# Patient Record
Sex: Male | Born: 1995 | Race: Black or African American | Hispanic: No | Marital: Single | Smoking: Never smoker
Health system: Southern US, Community
[De-identification: ages and names within clinical notes are randomized; demographics above are authoritative.]

## PROBLEM LIST (undated history)

## (undated) DIAGNOSIS — D869 Sarcoidosis, unspecified: Secondary | ICD-10-CM

## (undated) DIAGNOSIS — D84821 Immunodeficiency due to drugs: Secondary | ICD-10-CM

## (undated) DIAGNOSIS — H269 Unspecified cataract: Secondary | ICD-10-CM

## (undated) DIAGNOSIS — Z79899 Other long term (current) drug therapy: Secondary | ICD-10-CM

## (undated) DIAGNOSIS — H409 Unspecified glaucoma: Secondary | ICD-10-CM

## (undated) DIAGNOSIS — H44113 Panuveitis, bilateral: Secondary | ICD-10-CM

## (undated) HISTORY — PX: EYE SURGERY: SHX253

---
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
## 2017-08-11 ENCOUNTER — Other Ambulatory Visit: Payer: Self-pay

## 2017-08-11 ENCOUNTER — Encounter (HOSPITAL_COMMUNITY): Payer: Self-pay

## 2017-08-11 ENCOUNTER — Emergency Department (HOSPITAL_COMMUNITY): Payer: 59

## 2017-08-11 DIAGNOSIS — R05 Cough: Secondary | ICD-10-CM | POA: Diagnosis present

## 2017-08-11 DIAGNOSIS — R0981 Nasal congestion: Secondary | ICD-10-CM | POA: Insufficient documentation

## 2017-08-11 DIAGNOSIS — Z5321 Procedure and treatment not carried out due to patient leaving prior to being seen by health care provider: Secondary | ICD-10-CM | POA: Insufficient documentation

## 2017-08-11 DIAGNOSIS — R1110 Vomiting, unspecified: Secondary | ICD-10-CM | POA: Insufficient documentation

## 2017-08-11 LAB — CBC WITH DIFFERENTIAL/PLATELET
BASOS ABS: 0 10*3/uL (ref 0.0–0.1)
Basophils Relative: 0 %
EOS ABS: 0 10*3/uL (ref 0.0–0.7)
EOS PCT: 0 %
HCT: 38 % — ABNORMAL LOW (ref 39.0–52.0)
Hemoglobin: 12.8 g/dL — ABNORMAL LOW (ref 13.0–17.0)
Lymphocytes Relative: 8 %
Lymphs Abs: 1.1 10*3/uL (ref 0.7–4.0)
MCH: 29.8 pg (ref 26.0–34.0)
MCHC: 33.7 g/dL (ref 30.0–36.0)
MCV: 88.4 fL (ref 78.0–100.0)
Monocytes Absolute: 1.7 10*3/uL — ABNORMAL HIGH (ref 0.1–1.0)
Monocytes Relative: 13 %
Neutro Abs: 10.6 10*3/uL — ABNORMAL HIGH (ref 1.7–7.7)
Neutrophils Relative %: 79 %
PLATELETS: 541 10*3/uL — AB (ref 150–400)
RBC: 4.3 MIL/uL (ref 4.22–5.81)
RDW: 11.7 % (ref 11.5–15.5)
WBC: 13.4 10*3/uL — ABNORMAL HIGH (ref 4.0–10.5)

## 2017-08-11 LAB — BASIC METABOLIC PANEL
ANION GAP: 11 (ref 5–15)
BUN: 11 mg/dL (ref 6–20)
CO2: 23 mmol/L (ref 22–32)
Calcium: 9.4 mg/dL (ref 8.9–10.3)
Chloride: 98 mmol/L — ABNORMAL LOW (ref 101–111)
Creatinine, Ser: 0.89 mg/dL (ref 0.61–1.24)
GFR calc Af Amer: >60 mL/min (ref >60)
Glucose, Bld: 114 mg/dL — ABNORMAL HIGH (ref 65–99)
Potassium: 3.6 mmol/L (ref 3.5–5.1)
SODIUM: 132 mmol/L — AB (ref 135–145)

## 2017-08-11 NOTE — ED Triage Notes (Signed)
Pt presents with vomiting, productive cough and nasal congestion.  Pt reports having cough before going overseas to [place] but symptoms have worsened.  Pt denies any fever, denies abdominal pain or diarrhea.  Pt only complains of L scapular pain.

## 2017-08-12 ENCOUNTER — Emergency Department (HOSPITAL_COMMUNITY)
Admission: EM | Admit: 2017-08-12 | Discharge: 2017-08-12 | Disposition: A | Discharge status: Home / Self Care | Payer: 59 | Attending: Emergency Medicine | Admitting: Emergency Medicine

## 2017-08-12 HISTORY — DX: Sarcoidosis, unspecified: D86.9

## 2017-08-12 NOTE — ED Notes (Signed)
Pt called twice for vitals no reply.

## 2017-08-12 NOTE — ED Notes (Signed)
Pt called x2 for vitals no reply.

## 2017-08-15 ENCOUNTER — Emergency Department (HOSPITAL_COMMUNITY): Admission: EM | Admit: 2017-08-15 | Discharge: 2017-08-15 | Discharge status: Absent without leave | Payer: Self-pay

## 2017-08-15 ENCOUNTER — Other Ambulatory Visit: Payer: Self-pay

## 2017-08-15 ENCOUNTER — Emergency Department (HOSPITAL_COMMUNITY)
Admission: EM | Admit: 2017-08-15 | Discharge: 2017-08-16 | Disposition: A | Discharge status: Home / Self Care | Payer: 59 | Attending: Emergency Medicine | Admitting: Emergency Medicine

## 2017-08-15 ENCOUNTER — Emergency Department (HOSPITAL_COMMUNITY): Payer: 59

## 2017-08-15 ENCOUNTER — Encounter (HOSPITAL_COMMUNITY): Payer: Self-pay

## 2017-08-15 DIAGNOSIS — J189 Pneumonia, unspecified organism: Secondary | ICD-10-CM | POA: Diagnosis not present

## 2017-08-15 DIAGNOSIS — R509 Fever, unspecified: Secondary | ICD-10-CM | POA: Diagnosis present

## 2017-08-15 DIAGNOSIS — J181 Lobar pneumonia, unspecified organism: Secondary | ICD-10-CM

## 2017-08-15 MED ORDER — ACETAMINOPHEN 325 MG PO TABS
650.0000 mg | ORAL_TABLET | Freq: Four times a day (QID) | ORAL | Status: DC | PRN
  Administered 2017-08-15: 650 mg via ORAL
  Filled 2017-08-15 (×2): qty 2

## 2017-08-15 NOTE — ED Triage Notes (Signed)
Pt complains of a cough and a fever, he just got back from overseas on last Tuesday

## 2017-08-15 NOTE — ED Provider Notes (Signed)
Patient placed in Quick Look pathway, seen and evaluated   Chief Complaint: cough and fever  HPI:   Pt complains of cough and fever.  Pt recentlyreturned from a study abroad program  ROS: back pain  Physical Exam:   Gen: No distress  Neuro: Awake and Alert  Skin: Warm    Focused Exam: Lungs harsh breath sounds   Initiation of care has begun. The patient has been counseled on the process, plan, and necessity for staying for the completion/evaluation, and the remainder of the medical screening examination   Kenneth CheeksSofia, Leslie K, PA-C 08/15/17 1951    Terrilee FilesButler, Michael C, MD 08/16/17 [phone]

## 2017-08-16 LAB — URINALYSIS, ROUTINE W REFLEX MICROSCOPIC
Bilirubin Urine: NEGATIVE
Glucose, UA: NEGATIVE mg/dL
Hgb urine dipstick: NEGATIVE
KETONES UR: NEGATIVE mg/dL
Leukocytes, UA: NEGATIVE
NITRITE: NEGATIVE
PROTEIN: NEGATIVE mg/dL
Specific Gravity, Urine: 1.019 (ref 1.005–1.030)
pH: 6 (ref 5.0–8.0)

## 2017-08-16 LAB — CBC WITH DIFFERENTIAL/PLATELET
BASOS ABS: 0 10*3/uL (ref 0.0–0.1)
BASOS PCT: 0 %
EOS ABS: 0 10*3/uL (ref 0.0–0.7)
EOS PCT: 0 %
HCT: 34.7 % — ABNORMAL LOW (ref 39.0–52.0)
Hemoglobin: 12 g/dL — ABNORMAL LOW (ref 13.0–17.0)
LYMPHS PCT: 8 %
Lymphs Abs: 0.7 10*3/uL (ref 0.7–4.0)
MCH: 30.2 pg (ref 26.0–34.0)
MCHC: 34.6 g/dL (ref 30.0–36.0)
MCV: 87.2 fL (ref 78.0–100.0)
MONO ABS: 1.2 10*3/uL — AB (ref 0.1–1.0)
Monocytes Relative: 13 %
Neutro Abs: 7.1 10*3/uL (ref 1.7–7.7)
Neutrophils Relative %: 79 %
Platelets: 443 10*3/uL — ABNORMAL HIGH (ref 150–400)
RBC: 3.98 MIL/uL — AB (ref 4.22–5.81)
RDW: 11.7 % (ref 11.5–15.5)
WBC: 9 10*3/uL (ref 4.0–10.5)

## 2017-08-16 LAB — BASIC METABOLIC PANEL
ANION GAP: 12 (ref 5–15)
BUN: 19 mg/dL (ref 6–20)
CALCIUM: 9.4 mg/dL (ref 8.9–10.3)
CO2: 25 mmol/L (ref 22–32)
Chloride: 97 mmol/L — ABNORMAL LOW (ref 101–111)
Creatinine, Ser: 1.08 mg/dL (ref 0.61–1.24)
Glucose, Bld: 106 mg/dL — ABNORMAL HIGH (ref 65–99)
Potassium: 4 mmol/L (ref 3.5–5.1)
SODIUM: 134 mmol/L — AB (ref 135–145)

## 2017-08-16 MED ORDER — SODIUM CHLORIDE 0.9 % IV BOLUS (SEPSIS)
1000.0000 mL | Freq: Once | INTRAVENOUS | Status: AC
  Administered 2017-08-16: 1000 mL via INTRAVENOUS

## 2017-08-16 MED ORDER — SODIUM CHLORIDE 0.9 % IV SOLN: INTRAVENOUS | Status: DC

## 2017-08-16 MED ORDER — LEVOFLOXACIN IN D5W 500 MG/100ML IV SOLN
500.0000 mg | Freq: Once | INTRAVENOUS | Status: AC
  Administered 2017-08-16: 500 mg via INTRAVENOUS
  Filled 2017-08-16: qty 100

## 2017-08-16 MED ORDER — HYDROCOD POLST-CPM POLST ER 10-8 MG/5ML PO SUER
5.0000 mL | Freq: Once | ORAL | Status: AC
  Administered 2017-08-16: 5 mL via ORAL
  Filled 2017-08-16: qty 5

## 2017-08-16 MED ORDER — DEXTROSE 5 % IV SOLN: 500.0000 mg | Freq: Once | INTRAVENOUS | Status: DC

## 2017-08-16 MED ORDER — ONDANSETRON HCL 4 MG/2ML IJ SOLN
4.0000 mg | Freq: Once | INTRAMUSCULAR | Status: AC
  Administered 2017-08-16: 4 mg via INTRAVENOUS
  Filled 2017-08-16: qty 2

## 2017-08-16 MED ORDER — LEVOFLOXACIN 500 MG PO TABS: 500.0000 mg | ORAL_TABLET | Freq: Every day | ORAL | 0 refills | Status: DC

## 2017-08-16 MED ORDER — HYDROCOD POLST-CPM POLST ER 10-8 MG/5ML PO SUER: 5.0000 mL | Freq: Two times a day (BID) | ORAL | 0 refills | Status: DC | PRN

## 2017-08-16 NOTE — ED Notes (Signed)
Pt aware we need a urine sample. Urinal at bedside. 

## 2017-08-16 NOTE — ED Provider Notes (Signed)
[facility]-EMERGENCY DEPT Provider Note   CSN: [id number] Arrival date & time: 08/15/17  1942     History   Chief Complaint Chief Complaint  Patient presents with  . Fever  . Cough    HPI Kenneth Kemp is a 22 y.o. male.  Patient presents with persistent cough for several days, with fever today. No nausea, however, he has had post-tussive vomiting only. He has pain in the right mid-back with cough. No pain with breathing or SOB. He has a history of sarcoid affecting primarily bowel, on Humira regularly. No diarrhea or abdominal pain. He reports recent travel to the [place] and [place] for a 2 week study abroad.    The history is provided by the patient. No language interpreter was used.    Past Medical History:  Diagnosis Date  . Sarcoidosis     There are no active problems to display for this patient.   Past Surgical History:  Procedure Laterality Date  . EYE SURGERY         Home Medications    Prior to Admission medications   Not on File    Family History History reviewed. No pertinent family history.  Social History Social History   Tobacco Use  . Smoking status: Never Smoker  . Smokeless tobacco: Never Used  Substance Use Topics  . Alcohol use: No    Frequency: Never  . Drug use: No     Allergies   Patient has no known allergies.   Review of Systems Review of Systems  Constitutional: Negative for chills and fever.  HENT: Negative.   Respiratory: Positive for cough. Negative for shortness of breath.   Cardiovascular: Negative.  Negative for chest pain.  Gastrointestinal: Positive for vomiting (post-tussive). Negative for abdominal pain, diarrhea and nausea.  Musculoskeletal: Positive for back pain.  Skin: Negative.   Neurological: Negative.      Physical Exam Updated Vital Signs BP 124/79   Pulse 88   Temp 100.1 F (37.8 C) (Oral)   Resp 18   SpO2 97%   Physical Exam  Constitutional: He is  oriented to person, place, and time. He appears well-developed and well-nourished.  HENT:  Head: Normocephalic.  Neck: Normal range of motion. Neck supple.  Cardiovascular: Normal rate and regular rhythm.  No murmur heard. Pulmonary/Chest: Effort normal and breath sounds normal. He has no wheezes. He has no rales. He exhibits no tenderness.  Poor respiratory effort.  Abdominal: Soft. Bowel sounds are normal. There is no tenderness. There is no rebound and no guarding.  Musculoskeletal: Normal range of motion.  Neurological: He is alert and oriented to person, place, and time.  Skin: Skin is warm and dry. No rash noted.  Psychiatric: He has a normal mood and affect.     ED Treatments / Results  Labs (all labs ordered are listed, but only abnormal results are displayed) Labs Reviewed  CBC WITH DIFFERENTIAL/PLATELET  BASIC METABOLIC PANEL  URINALYSIS, ROUTINE W REFLEX MICROSCOPIC    EKG  EKG Interpretation None       Radiology Dg Chest 2 View  Result Date: 08/15/2017 CLINICAL DATA:  Productive cough, history of sarcoid EXAM: CHEST  2 VIEW COMPARISON:  08/11/2017 FINDINGS: Small focus of opacity in the right lateral lung base. No pleural effusion. Normal heart size. No pneumothorax. IMPRESSION: Small focus of opacity in the right lateral lung base may reflect an infiltrate. Electronically Signed   By: Jasmine Pang M.D.   On: 08/15/2017 20:18  Procedures Procedures (including critical care time)  Medications Ordered in ED Medications  acetaminophen (TYLENOL) tablet 650 mg (650 mg Oral Given 08/15/17 1952)  ondansetron (ZOFRAN) injection 4 mg (not administered)  sodium chloride 0.9 % bolus 1,000 mL (not administered)  chlorpheniramine-HYDROcodone (TUSSIONEX) 10-8 MG/5ML suspension 5 mL (not administered)  azithromycin (ZITHROMAX) 500 mg in dextrose 5 % 250 mL IVPB (not administered)     Initial Impression / Assessment and Plan / ED Course  I have reviewed the triage  vital signs and the nursing notes.  Pertinent labs & imaging results that were available during my care of the patient were reviewed by me and considered in my medical decision making (see chart for details).  Clinical Course as of Aug 16 156  Tue Aug 16, 2017  0032 DG Chest 2 View [FM]    Clinical Course User Index [FM] Cherylann RatelMoche, Faithcaren W, Wisconsintudent-PA    Patient presents with cough and fever, feeling unwell. He reports pain in the right mid-back with coughing. No SOB or painful respirations.   Despite recent travel, feel symptoms present CAP. Tachycardia only with fever. No pleuritic pain, hemoptysis, hypoxia. He is on Humira for sarcoid so is considered immunocompromised. Will treat PNA (RLL infiltrate on x-ray) with Levaquin.  IVF's and antibiotics completed. He VS remain stable. He can be discharged home.  Final Clinical Impressions(s) / ED Diagnoses   Final diagnoses:  None   1. RLL pneumonia  ED Discharge Orders    None       Elpidio AnisUpstill, Elly Haffey, PA-C 08/16/17 [id number]    Shon BatonHorton, Courtney F, MD 08/16/17 [phone]

## 2018-09-21 ENCOUNTER — Emergency Department (HOSPITAL_COMMUNITY)
Admission: EM | Admit: 2018-09-21 | Discharge: 2018-09-21 | Disposition: A | Discharge status: Home / Self Care | Payer: Self-pay | Attending: Emergency Medicine | Admitting: Emergency Medicine

## 2018-09-21 ENCOUNTER — Emergency Department (HOSPITAL_COMMUNITY): Payer: Self-pay

## 2018-09-21 ENCOUNTER — Encounter (HOSPITAL_COMMUNITY): Payer: Self-pay

## 2018-09-21 ENCOUNTER — Other Ambulatory Visit: Payer: Self-pay

## 2018-09-21 DIAGNOSIS — S82892A Other fracture of left lower leg, initial encounter for closed fracture: Secondary | ICD-10-CM

## 2018-09-21 DIAGNOSIS — Y999 Unspecified external cause status: Secondary | ICD-10-CM | POA: Insufficient documentation

## 2018-09-21 DIAGNOSIS — Y929 Unspecified place or not applicable: Secondary | ICD-10-CM | POA: Insufficient documentation

## 2018-09-21 DIAGNOSIS — W1840XA Slipping, tripping and stumbling without falling, unspecified, initial encounter: Secondary | ICD-10-CM | POA: Insufficient documentation

## 2018-09-21 DIAGNOSIS — Y9344 Activity, trampolining: Secondary | ICD-10-CM | POA: Insufficient documentation

## 2018-09-21 DIAGNOSIS — S8254XA Nondisplaced fracture of medial malleolus of right tibia, initial encounter for closed fracture: Secondary | ICD-10-CM | POA: Insufficient documentation

## 2018-09-21 NOTE — ED Provider Notes (Signed)
[facility]-EMERGENCY DEPT Provider Note   CSN: [id number] Arrival date & time: 09/21/18  9242    History   Chief Complaint Chief Complaint  Patient presents with  . Ankle Injury    HPI Kenneth Kemp is a 23 y.o. male.     The history is provided by the patient.  Ankle Injury  This is a new problem. The current episode started 2 days ago. The problem occurs constantly. The problem has been gradually worsening. Associated symptoms comments: Pain, swelling of the right ankle.  No numbness in the toes or other injury.  He was at a trampoline park on Tuesday night and came down wrong and his ankle twisted underneath him.  Since that time he has not really been able to walk on the right foot and it is continuing to swell and hurt worse.. The symptoms are aggravated by walking, bending and twisting. Nothing relieves the symptoms. He has tried rest for the symptoms. The treatment provided no relief.    Past Medical History:  Diagnosis Date  . Sarcoidosis     There are no active problems to display for this patient.   Past Surgical History:  Procedure Laterality Date  . EYE SURGERY          Home Medications    Prior to Admission medications   Medication Sig Start Date End Date Taking? Authorizing Provider  PRESCRIPTION MEDICATION Inject 10 mg into the muscle once a week. Pt states he was doing Humira weekly. He is unsure of Mg. York Spaniel it was sent in the mail and he hasn't took it in a couple of weeks cause of no insurance.   Yes [provider]  chlorpheniramine-HYDROcodone (TUSSIONEX PENNKINETIC ER) 10-8 MG/5ML SUER Take 5 mLs by mouth every 12 (twelve) hours as needed for cough. Patient not taking: Reported on 09/21/2018 08/16/17   Elpidio Anis, PA-C  levofloxacin (LEVAQUIN) 500 MG tablet Take 1 tablet (500 mg total) by mouth daily. Patient not taking: Reported on 09/21/2018 08/16/17   Elpidio Anis, PA-C    Family History Family History    Family history unknown: Yes    Social History Social History   Tobacco Use  . Smoking status: Never Smoker  . Smokeless tobacco: Never Used  Substance Use Topics  . Alcohol use: No    Frequency: Never  . Drug use: No     Allergies   Patient has no known allergies.   Review of Systems Review of Systems  All other systems reviewed and are negative.    Physical Exam Updated Vital Signs BP (!) 150/85 (BP Location: Right Arm)   Pulse (!) 112   Temp 99.8 F (37.7 C) (Oral)   Resp 16   Ht 5\' 11"  (1.803 m)   Wt 88.5 kg   SpO2 98%   BMI 27.20 kg/m   Physical Exam Vitals signs and nursing note reviewed.  Constitutional:      Appearance: Normal appearance. He is normal weight.  HENT:     Head: Normocephalic and atraumatic.  Eyes:     Pupils: Pupils are equal, round, and reactive to light.  Cardiovascular:     Rate and Rhythm: Tachycardia present.     Pulses: Normal pulses.  Pulmonary:     Effort: Pulmonary effort is normal.  Musculoskeletal:     Right ankle: He exhibits decreased range of motion and swelling. He exhibits normal pulse. Tenderness. Lateral malleolus, medial malleolus and head of 5th metatarsal tenderness found. No proximal  fibula tenderness found.  Neurological:     General: No focal deficit present.     Mental Status: He is alert. Mental status is at baseline.  Psychiatric:        Mood and Affect: Mood normal.      ED Treatments / Results  Labs (all labs ordered are listed, but only abnormal results are displayed) Labs Reviewed - No data to display  EKG None  Radiology Dg Ankle Complete Right  Result Date: 09/21/2018 CLINICAL DATA:  Right ankle pain post trampoline injury. EXAM: RIGHT ANKLE - COMPLETE 3+ VIEW COMPARISON:  None. FINDINGS: There is a severely comminuted fracture of the medial malleolus with extension to the ankle mortise. There is a small joint effusion. Medial and lateral soft tissue swelling noted. IMPRESSION: Severely  comminuted fracture of the medial malleolus with extension to the ankle mortise. Electronically Signed   By: Ted Mcalpine M.D.   On: 09/21/2018 09:35   Dg Foot Complete Right  Result Date: 09/21/2018 CLINICAL DATA:  Pain post trampoline injury. EXAM: RIGHT FOOT COMPLETE - 3+ VIEW COMPARISON:  None. FINDINGS: There is no evidence of fracture or dislocation of the right foot. There is a severely comminuted mildly displaced fracture of the medial malleolus. IMPRESSION: Severely comminuted mildly displaced fracture of the medial malleolus. No evidence of right foot fractures. Electronically Signed   By: Ted Mcalpine M.D.   On: 09/21/2018 09:36    Procedures Procedures (including critical care time)  Medications Ordered in ED Medications - No data to display   Initial Impression / Assessment and Plan / ED Course  I have reviewed the triage vital signs and the nursing notes.  Pertinent labs & imaging results that were available during my care of the patient were reviewed by me and considered in my medical decision making (see chart for details).        Patient is a healthy 23 year old male with an injury to his right ankle at a trampoline park 2 days ago.  Ankle is swollen but neurovascularly intact with normal sensation and movement of the toes.  He has no fibular head tenderness.  Patient's x-ray show a severely comminuted fracture of the medial malleolus with extension to the ankle mortise.  Foot films are normal.  Will discuss with orthopedics.  10:47 AM Discussed patient with Dr. Victorino Dike who recommended Cadillac splint, elevation crutches and they will see him in the office tomorrow for suspected surgery next week.  Findings discussed with the patient.  He will call them when he gets home.  Patient was made nonweightbearing.  Final Clinical Impressions(s) / ED Diagnoses   Final diagnoses:  Closed left ankle fracture, initial encounter    ED Discharge Orders    None         Gwyneth Sprout, MD 09/21/18 1050

## 2018-09-21 NOTE — Discharge Instructions (Signed)
You need to elevate your toes above your nose to help with the swelling in your ankle.  You should not put any weight on the leg and use crutches when getting around.  No work until cleared by the orthopedist.  Bonita Quin can take Tylenol and ibuprofen as needed for pain.

## 2018-09-21 NOTE — ED Triage Notes (Signed)
Patient was at the trampoline park and came down wrong on his right ankle 2 days ago. paatient has swelling and pain.

## 2018-09-25 ENCOUNTER — Other Ambulatory Visit: Payer: Self-pay

## 2018-09-25 ENCOUNTER — Encounter (HOSPITAL_BASED_OUTPATIENT_CLINIC_OR_DEPARTMENT_OTHER): Payer: Self-pay | Admitting: *Deleted

## 2018-09-25 ENCOUNTER — Other Ambulatory Visit (HOSPITAL_COMMUNITY): Payer: Self-pay | Admitting: Orthopedic Surgery

## 2018-09-26 ENCOUNTER — Encounter (HOSPITAL_BASED_OUTPATIENT_CLINIC_OR_DEPARTMENT_OTHER): Payer: Self-pay | Admitting: *Deleted

## 2018-09-26 ENCOUNTER — Encounter (HOSPITAL_BASED_OUTPATIENT_CLINIC_OR_DEPARTMENT_OTHER)
Admission: RE | Disposition: A | Discharge status: Home / Self Care | Payer: Self-pay | Source: Home / Self Care | Attending: Orthopedic Surgery

## 2018-09-26 ENCOUNTER — Ambulatory Visit (HOSPITAL_BASED_OUTPATIENT_CLINIC_OR_DEPARTMENT_OTHER): Payer: Self-pay | Admitting: Anesthesiology

## 2018-09-26 ENCOUNTER — Ambulatory Visit (HOSPITAL_BASED_OUTPATIENT_CLINIC_OR_DEPARTMENT_OTHER)
Admission: RE | Admit: 2018-09-26 | Discharge: 2018-09-26 | Disposition: A | Discharge status: Home / Self Care | Payer: Self-pay | Attending: Orthopedic Surgery | Admitting: Orthopedic Surgery

## 2018-09-26 DIAGNOSIS — D869 Sarcoidosis, unspecified: Secondary | ICD-10-CM | POA: Insufficient documentation

## 2018-09-26 DIAGNOSIS — S8251XA Displaced fracture of medial malleolus of right tibia, initial encounter for closed fracture: Secondary | ICD-10-CM | POA: Insufficient documentation

## 2018-09-26 DIAGNOSIS — Z91120 Patient's intentional underdosing of medication regimen due to financial hardship: Secondary | ICD-10-CM | POA: Insufficient documentation

## 2018-09-26 DIAGNOSIS — T394X6A Underdosing of antirheumatics, not elsewhere classified, initial encounter: Secondary | ICD-10-CM | POA: Insufficient documentation

## 2018-09-26 DIAGNOSIS — Y9344 Activity, trampolining: Secondary | ICD-10-CM | POA: Insufficient documentation

## 2018-09-26 DIAGNOSIS — H44113 Panuveitis, bilateral: Secondary | ICD-10-CM | POA: Insufficient documentation

## 2018-09-26 DIAGNOSIS — H409 Unspecified glaucoma: Secondary | ICD-10-CM | POA: Insufficient documentation

## 2018-09-26 HISTORY — PX: ORIF ANKLE FRACTURE: SHX5408

## 2018-09-26 HISTORY — DX: Unspecified cataract: H26.9

## 2018-09-26 HISTORY — DX: Panuveitis, bilateral: H44.113

## 2018-09-26 HISTORY — DX: Other long term (current) drug therapy: Z79.899

## 2018-09-26 HISTORY — DX: Immunodeficiency due to drugs: D84.821

## 2018-09-26 HISTORY — DX: Unspecified glaucoma: H40.9

## 2018-09-26 LAB — COMPREHENSIVE METABOLIC PANEL
ALBUMIN: 3.8 g/dL (ref 3.5–5.0)
ALT: 48 U/L — ABNORMAL HIGH (ref 0–44)
AST: 38 U/L (ref 15–41)
Alkaline Phosphatase: 58 U/L (ref 38–126)
Anion gap: 10 (ref 5–15)
BILIRUBIN TOTAL: 1.7 mg/dL — AB (ref 0.3–1.2)
BUN: 18 mg/dL (ref 6–20)
CO2: 23 mmol/L (ref 22–32)
Calcium: 9.4 mg/dL (ref 8.9–10.3)
Chloride: 104 mmol/L (ref 98–111)
Creatinine, Ser: 1.02 mg/dL (ref 0.61–1.24)
GFR calc Af Amer: >60 mL/min (ref >60)
GFR calc non Af Amer: >60 mL/min (ref >60)
GLUCOSE: 95 mg/dL (ref 70–99)
Potassium: 4.3 mmol/L (ref 3.5–5.1)
Sodium: 137 mmol/L (ref 135–145)
Total Protein: 7.1 g/dL (ref 6.5–8.1)

## 2018-09-26 SURGERY — OPEN REDUCTION INTERNAL FIXATION (ORIF) ANKLE FRACTURE
Anesthesia: General | Site: Ankle | Laterality: Right

## 2018-09-26 MED ORDER — DEXAMETHASONE SODIUM PHOSPHATE 10 MG/ML IJ SOLN
INTRAMUSCULAR | Status: AC
  Filled 2018-09-26: qty 1

## 2018-09-26 MED ORDER — SENNA 8.6 MG PO TABS: 2.0000 | ORAL_TABLET | Freq: Two times a day (BID) | ORAL | 0 refills | Status: AC

## 2018-09-26 MED ORDER — FENTANYL CITRATE (PF) 100 MCG/2ML IJ SOLN
50.0000 ug | INTRAMUSCULAR | Status: AC | PRN
  Administered 2018-09-26: 50 ug via INTRAVENOUS
  Administered 2018-09-26 (×2): 25 ug via INTRAVENOUS

## 2018-09-26 MED ORDER — ONDANSETRON HCL 4 MG/2ML IJ SOLN
INTRAMUSCULAR | Status: DC | PRN
  Administered 2018-09-26: 4 mg via INTRAVENOUS

## 2018-09-26 MED ORDER — ASPIRIN EC 81 MG PO TBEC: 81.0000 mg | DELAYED_RELEASE_TABLET | Freq: Two times a day (BID) | ORAL | 0 refills | Status: AC

## 2018-09-26 MED ORDER — ONDANSETRON HCL 4 MG/2ML IJ SOLN
INTRAMUSCULAR | Status: AC
  Filled 2018-09-26: qty 2

## 2018-09-26 MED ORDER — ACETAMINOPHEN 500 MG PO TABS
1000.0000 mg | ORAL_TABLET | Freq: Once | ORAL | Status: AC
  Administered 2018-09-26: 1000 mg via ORAL

## 2018-09-26 MED ORDER — CHLORHEXIDINE GLUCONATE 4 % EX LIQD: 60.0000 mL | Freq: Once | CUTANEOUS | Status: DC

## 2018-09-26 MED ORDER — ACETAMINOPHEN 500 MG PO TABS
ORAL_TABLET | ORAL | Status: AC
  Filled 2018-09-26: qty 2

## 2018-09-26 MED ORDER — SODIUM CHLORIDE 0.9 % IV SOLN: INTRAVENOUS | Status: DC

## 2018-09-26 MED ORDER — DEXAMETHASONE SODIUM PHOSPHATE 4 MG/ML IJ SOLN
INTRAMUSCULAR | Status: DC | PRN
  Administered 2018-09-26: 10 mg via INTRAVENOUS

## 2018-09-26 MED ORDER — MIDAZOLAM HCL 2 MG/2ML IJ SOLN
1.0000 mg | INTRAMUSCULAR | Status: DC | PRN
  Administered 2018-09-26: 2 mg via INTRAVENOUS

## 2018-09-26 MED ORDER — LACTATED RINGERS IV SOLN
INTRAVENOUS | Status: DC
  Administered 2018-09-26 (×2): via INTRAVENOUS

## 2018-09-26 MED ORDER — PHENYLEPHRINE 40 MCG/ML (10ML) SYRINGE FOR IV PUSH (FOR BLOOD PRESSURE SUPPORT)
PREFILLED_SYRINGE | INTRAVENOUS | Status: DC | PRN
  Administered 2018-09-26: 80 ug via INTRAVENOUS

## 2018-09-26 MED ORDER — LIDOCAINE HCL (CARDIAC) PF 100 MG/5ML IV SOSY
PREFILLED_SYRINGE | INTRAVENOUS | Status: DC | PRN
  Administered 2018-09-26: 80 mg via INTRAVENOUS

## 2018-09-26 MED ORDER — CEFAZOLIN SODIUM-DEXTROSE 2-4 GM/100ML-% IV SOLN
INTRAVENOUS | Status: AC
  Filled 2018-09-26: qty 100

## 2018-09-26 MED ORDER — OXYCODONE HCL 5 MG PO TABS: 5.0000 mg | ORAL_TABLET | ORAL | 0 refills | Status: AC | PRN

## 2018-09-26 MED ORDER — DOCUSATE SODIUM 100 MG PO CAPS: 100.0000 mg | ORAL_CAPSULE | Freq: Two times a day (BID) | ORAL | 0 refills | Status: AC

## 2018-09-26 MED ORDER — FENTANYL CITRATE (PF) 100 MCG/2ML IJ SOLN
INTRAMUSCULAR | Status: AC
  Filled 2018-09-26: qty 2

## 2018-09-26 MED ORDER — MIDAZOLAM HCL 2 MG/2ML IJ SOLN
INTRAMUSCULAR | Status: AC
  Filled 2018-09-26: qty 2

## 2018-09-26 MED ORDER — BUPIVACAINE-EPINEPHRINE (PF) 0.5% -1:200000 IJ SOLN
INTRAMUSCULAR | Status: DC | PRN
  Administered 2018-09-26: 30 mL via PERINEURAL
  Administered 2018-09-26: 10 mL via PERINEURAL

## 2018-09-26 MED ORDER — CEFAZOLIN SODIUM-DEXTROSE 2-4 GM/100ML-% IV SOLN
2.0000 g | INTRAVENOUS | Status: AC
  Administered 2018-09-26: 2 g via INTRAVENOUS

## 2018-09-26 MED ORDER — CLONIDINE HCL (ANALGESIA) 100 MCG/ML EP SOLN
EPIDURAL | Status: DC | PRN
  Administered 2018-09-26: 100 ug

## 2018-09-26 MED ORDER — SCOPOLAMINE 1 MG/3DAYS TD PT72: 1.0000 | MEDICATED_PATCH | Freq: Once | TRANSDERMAL | Status: DC | PRN

## 2018-09-26 MED ORDER — PROPOFOL 10 MG/ML IV BOLUS
INTRAVENOUS | Status: DC | PRN
  Administered 2018-09-26: 200 mg via INTRAVENOUS

## 2018-09-26 MED ORDER — 0.9 % SODIUM CHLORIDE (POUR BTL) OPTIME
TOPICAL | Status: DC | PRN
  Administered 2018-09-26: 250 mL

## 2018-09-26 MED ORDER — PROMETHAZINE HCL 25 MG/ML IJ SOLN: 6.2500 mg | INTRAMUSCULAR | Status: DC | PRN

## 2018-09-26 MED ORDER — LIDOCAINE 2% (20 MG/ML) 5 ML SYRINGE
INTRAMUSCULAR | Status: AC
  Filled 2018-09-26: qty 5

## 2018-09-26 MED ORDER — PHENYLEPHRINE 40 MCG/ML (10ML) SYRINGE FOR IV PUSH (FOR BLOOD PRESSURE SUPPORT)
PREFILLED_SYRINGE | INTRAVENOUS | Status: AC
  Filled 2018-09-26: qty 10

## 2018-09-26 MED ORDER — FENTANYL CITRATE (PF) 100 MCG/2ML IJ SOLN: 25.0000 ug | INTRAMUSCULAR | Status: DC | PRN

## 2018-09-26 SURGICAL SUPPLY — 78 items
BANDAGE ESMARK 6X9 LF (GAUZE/BANDAGES/DRESSINGS) ×1 IMPLANT
BIT DRILL 2.4X140 LONG SOLID (BIT) ×2 IMPLANT
BIT DRILL CANNULTD 2.6 X 130MM (DRILL) IMPLANT
BLADE SURG 15 STRL LF DISP TIS (BLADE) ×2 IMPLANT
BLADE SURG 15 STRL SS (BLADE) ×4
BNDG COHESIVE 4X5 TAN STRL (GAUZE/BANDAGES/DRESSINGS) ×3 IMPLANT
BNDG COHESIVE 6X5 TAN STRL LF (GAUZE/BANDAGES/DRESSINGS) ×3 IMPLANT
BNDG ESMARK 4X9 LF (GAUZE/BANDAGES/DRESSINGS) IMPLANT
BNDG ESMARK 6X9 LF (GAUZE/BANDAGES/DRESSINGS) ×3
CANISTER SUCT 1200ML W/VALVE (MISCELLANEOUS) ×3 IMPLANT
CHLORAPREP W/TINT 26ML (MISCELLANEOUS) ×3 IMPLANT
COVER BACK TABLE 60X90IN (DRAPES) ×3 IMPLANT
COVER WAND RF STERILE (DRAPES) IMPLANT
CUFF TOURNIQUET SINGLE 34IN LL (TOURNIQUET CUFF) ×2 IMPLANT
DECANTER SPIKE VIAL GLASS SM (MISCELLANEOUS) IMPLANT
DRAPE EXTREMITY T 121X128X90 (DISPOSABLE) ×3 IMPLANT
DRAPE OEC MINIVIEW 54X84 (DRAPES) ×3 IMPLANT
DRAPE U-SHAPE 47X51 STRL (DRAPES) ×3 IMPLANT
DRILL CANNULATED 2.6 X 130MM (DRILL) ×3
DRSG MEPITEL 4X7.2 (GAUZE/BANDAGES/DRESSINGS) ×3 IMPLANT
DRSG PAD ABDOMINAL 8X10 ST (GAUZE/BANDAGES/DRESSINGS) ×6 IMPLANT
ELECT REM PT RETURN 9FT ADLT (ELECTROSURGICAL) ×3
ELECTRODE REM PT RTRN 9FT ADLT (ELECTROSURGICAL) ×1 IMPLANT
GAUZE SPONGE 4X4 12PLY STRL (GAUZE/BANDAGES/DRESSINGS) ×3 IMPLANT
GLOVE BIO SURGEON STRL SZ 6.5 (GLOVE) ×1 IMPLANT
GLOVE BIO SURGEON STRL SZ8 (GLOVE) ×3 IMPLANT
GLOVE BIO SURGEONS STRL SZ 6.5 (GLOVE) ×1
GLOVE BIOGEL PI IND STRL 7.0 (GLOVE) IMPLANT
GLOVE BIOGEL PI IND STRL 8 (GLOVE) ×2 IMPLANT
GLOVE BIOGEL PI INDICATOR 7.0 (GLOVE) ×4
GLOVE BIOGEL PI INDICATOR 8 (GLOVE) ×4
GLOVE ECLIPSE 6.5 STRL STRAW (GLOVE) ×2 IMPLANT
GLOVE ECLIPSE 8.0 STRL XLNG CF (GLOVE) ×3 IMPLANT
GLOVE EXAM NITRILE MD LF STRL (GLOVE) ×2 IMPLANT
GOWN STRL REUS W/ TWL LRG LVL3 (GOWN DISPOSABLE) ×1 IMPLANT
GOWN STRL REUS W/ TWL XL LVL3 (GOWN DISPOSABLE) ×2 IMPLANT
GOWN STRL REUS W/TWL LRG LVL3 (GOWN DISPOSABLE) ×4
GOWN STRL REUS W/TWL XL LVL3 (GOWN DISPOSABLE) ×4
K-WIRE SMOOTH TROCAR 2.0X150 (WIRE) ×3
K-WIRE SNGL END 1.2X150 (MISCELLANEOUS) ×3
KWIRE SMOOTH TROCAR 2.0X150 (WIRE) IMPLANT
KWIRE SNGL END 1.2X150 (MISCELLANEOUS) IMPLANT
NEEDLE HYPO 22GX1.5 SAFETY (NEEDLE) IMPLANT
NS IRRIG 1000ML POUR BTL (IV SOLUTION) ×3 IMPLANT
PACK BASIN DAY SURGERY FS (CUSTOM PROCEDURE TRAY) ×3 IMPLANT
PAD CAST 4YDX4 CTTN HI CHSV (CAST SUPPLIES) ×1 IMPLANT
PADDING CAST ABS 4INX4YD NS (CAST SUPPLIES)
PADDING CAST ABS COTTON 4X4 ST (CAST SUPPLIES) IMPLANT
PADDING CAST COTTON 4X4 STRL (CAST SUPPLIES) ×2
PADDING CAST COTTON 6X4 STRL (CAST SUPPLIES) ×3 IMPLANT
PENCIL BUTTON HOLSTER BLD 10FT (ELECTRODE) ×3 IMPLANT
PLATE MEDIAL MALLEOLUS 4H HOOK (Plate) ×2 IMPLANT
SANITIZER HAND PURELL 535ML FO (MISCELLANEOUS) ×3 IMPLANT
SCREW 4.0 X 40 LT (Screw) ×2 IMPLANT
SCREW LOCK PLATE R3 3.5X44 (Screw) ×2 IMPLANT
SCREW R3CON N/L PLATE 3.5X44 (Screw) ×2 IMPLANT
SHEET MEDIUM DRAPE 40X70 STRL (DRAPES) ×3 IMPLANT
SLEEVE SCD COMPRESS KNEE MED (MISCELLANEOUS) ×3 IMPLANT
SPLINT FAST PLASTER 5X30 (CAST SUPPLIES) ×40
SPLINT PLASTER CAST FAST 5X30 (CAST SUPPLIES) ×20 IMPLANT
SPONGE LAP 18X18 RF (DISPOSABLE) ×3 IMPLANT
STOCKINETTE 6  STRL (DRAPES) ×2
STOCKINETTE 6 STRL (DRAPES) ×1 IMPLANT
SUCTION FRAZIER HANDLE 10FR (MISCELLANEOUS) ×2
SUCTION TUBE FRAZIER 10FR DISP (MISCELLANEOUS) ×1 IMPLANT
SUT ETHILON 3 0 PS 1 (SUTURE) ×3 IMPLANT
SUT FIBERWIRE #2 38 T-5 BLUE (SUTURE)
SUT MNCRL AB 3-0 PS2 18 (SUTURE) ×2 IMPLANT
SUT VIC AB 0 SH 27 (SUTURE) IMPLANT
SUT VIC AB 2-0 SH 27 (SUTURE) ×2
SUT VIC AB 2-0 SH 27XBRD (SUTURE) ×1 IMPLANT
SUTURE FIBERWR #2 38 T-5 BLUE (SUTURE) IMPLANT
SYR BULB 3OZ (MISCELLANEOUS) ×3 IMPLANT
SYR CONTROL 10ML LL (SYRINGE) IMPLANT
TOWEL GREEN STERILE FF (TOWEL DISPOSABLE) ×6 IMPLANT
TUBE CONNECTING 20'X1/4 (TUBING) ×1
TUBE CONNECTING 20X1/4 (TUBING) ×2 IMPLANT
UNDERPAD 30X30 (UNDERPADS AND DIAPERS) ×3 IMPLANT

## 2018-09-26 NOTE — Op Note (Signed)
09/26/2018  2:03 PM  PATIENT:  Kenneth Kemp  23 y.o. male  PRE-OPERATIVE DIAGNOSIS:  right ankle medial malleolus fracture  POST-OPERATIVE DIAGNOSIS:  right ankle medial malleolus fracture  Procedure(s): 1.  Open treatment of right ankle medial malleolus fracture with internal fixation   2.  Stress exam of right ankle under fluoroscopy   3.  AP, mortise and lateral xrays of the right ankle  SURGEON:  Toni Arthurs, MD  ASSISTANT: Alfredo Martinez, PA-C  ANESTHESIA:   General, regional  EBL:  minimal   TOURNIQUET:   Total Tourniquet Time Documented: Thigh (Right) - 51 minutes Total: Thigh (Right) - 51 minutes  COMPLICATIONS:  None apparent  DISPOSITION:  Extubated, awake and stable to recovery.  INDICATION FOR PROCEDURE: The patient is a 23 year old male with a past medical history significant for sarcoidosis.  He injured his ankle just over a week ago jumping on a trampoline.  He has a comminuted and displaced medial malleolus fracture and presents now for operative treatment.  The risks and benefits of the alternative treatment options have been discussed in detail.  The patient wishes to proceed with surgery and specifically understands risks of bleeding, infection, nerve damage, blood clots, need for additional surgery, amputation and death.  PROCEDURE IN DETAIL:  After pre operative consent was obtained, and the correct operative site was identified, the patient was brought to the operating room and placed supine on the OR table.  Anesthesia was administered.  Pre-operative antibiotics were administered.  A surgical timeout was taken.  The right lower extremity was prepped and draped in standard sterile fashion with a tourniquet around the thigh.  The extremity was elevated and the tourniquet was inflated to 250 mmHg.  A longitudinal incision was then made over the medial malleolus.  Dissection was carried down through the subcutaneous tissues.  Periosteum was incised over the medial  malleolus.  The fracture site was identified.  It was irrigated copiously and cleaned of all hematoma.  The fracture was reduced and and was provisionally pinned through the medial malleolus across the fracture site.  A medial malleolus hook plate from [employer] was selected.  It was placed over the guidepin and impacted in place with a bone tamp.  The K wire was then overdrilled and a 4 mm cannulated screw was inserted.  The screw was tightened securing the plate to the bone at the medial malleolus and compressing the fracture site appropriately.  The plate was then secured to the metaphyseal bone of the distal tibia with locking and nonlocking 3.5 millimeter screws.  This served to buttress the oblique portion of the fracture proximally.  AP, lateral and mortise radiographs confirmed appropriate reduction of the fracture in appropriate position and length of all hardware.  Stress examination was then performed.  Dorsiflexion and external rotation stress was applied to the supinated forefoot.  There was no widening of the ankle mortise or medial clear space.  The wound was then irrigated copiously.  The deltoid ligament and periosteum were repaired over the fracture site with figure-of-eight 2-0 Vicryl sutures.  Subcutaneous tissues were then approximated with inverted simple sutures of 3-0 Monocryl.  The skin incision was closed with 3-0 nylon.  Sterile dressings were applied followed by a well-padded short leg splint.  The tourniquet was released after application of the dressings.  The patient was awakened from anesthesia and transported to the recovery room in stable condition.   FOLLOW UP PLAN: Nonweightbearing on the right lower extremity for 6  weeks postop.  Follow-up in 2 weeks for suture removal and conversion to a short leg cast.  Aspirin for DVT prophylaxis.   RADIOGRAPHS: AP, mortise and lateral radiographs of the right ankle are obtained intraoperatively.  These show interval reduction and  fixation of the medial malleolus fracture.  Hardware is appropriately positioned and of the appropriate lengths.    Alfredo Martinez PA-C was present and scrubbed for the duration of the operative case. His assistance was essential in positioning the patient, prepping and draping, gaining and maintaining exposure, performing the operation, closing and dressing the wounds and applying the splint.

## 2018-09-26 NOTE — Anesthesia Postprocedure Evaluation (Signed)
Anesthesia Post Note  Patient: Kenneth Kemp  Procedure(s) Performed: Open treatment of right ankle fracture with internal fixation (Right Ankle)     Patient location during evaluation: PACU Anesthesia Type: General Level of consciousness: awake and alert, awake and oriented Pain management: pain level controlled Vital Signs Assessment: post-procedure vital signs reviewed and stable Respiratory status: spontaneous breathing, nonlabored ventilation and respiratory function stable Cardiovascular status: blood pressure returned to baseline and stable Postop Assessment: no apparent nausea or vomiting Anesthetic complications: no    Last Vitals:  Vitals:   09/26/18 1404 09/26/18 1415  BP: (!) 96/42 (!) 108/58  Pulse: 77 68  Resp: 19 14  SpO2: 100% 100%    Last Pain:  Vitals:   09/26/18 1404  PainSc: Asleep                 Cecile Hearing

## 2018-09-26 NOTE — Anesthesia Procedure Notes (Signed)
Anesthesia Regional Block: Popliteal block   Pre-Anesthetic Checklist: ,, timeout performed, Correct Patient, Correct Site, Correct Laterality, Correct Procedure, Correct Position, site marked, Risks and benefits discussed,  Surgical consent,  Pre-op evaluation,  At surgeon's request and post-op pain management  Laterality: Right  Prep: chloraprep       Needles:  Injection technique: Single-shot  Needle Type: Echogenic Needle     Needle Length: 9cm  Needle Gauge: 21     Additional Needles:   Procedures:,,,, ultrasound used (permanent image in chart),,,,  Narrative:  Start time: 09/26/2018 12:01 PM End time: 09/26/2018 12:06 PM Injection made incrementally with aspirations every 5 mL.  Performed by: Personally  Anesthesiologist: Cecile Hearing, MD  Additional Notes: No pain on injection. No increased resistance to injection. Injection made in 5cc increments.  Good needle visualization.  Patient tolerated procedure well.

## 2018-09-26 NOTE — Discharge Instructions (Addendum)
Kenneth Hewitt, MD °[facility] ° °Please read the following information regarding your care after surgery. ° °Medications  °You only need a prescription for the narcotic pain medicine (ex. oxycodone, Percocet, Norco).  All of the other medicines listed below are available over the counter. °X Aleve 2 pills twice a day for the first 3 days after surgery. °X acetominophen (Tylenol) 650 mg every 4-6 hours as you need for minor to moderate pain °X oxycodone as prescribed for severe pain ° °Narcotic pain medicine (ex. oxycodone, Percocet, Vicodin) will cause constipation.  To prevent this problem, take the following medicines while you are taking any pain medicine. °X docusate sodium (Colace) 100 mg twice a day X senna (Senokot) 2 tablets twice a day ° °X To help prevent blood clots, take a baby aspirin (81 mg) twice a day after surgery.  You should also get up every hour while you are awake to move around.   ° °Weight Bearing °X Do not bear any weight on the operated leg or foot. ° °Cast / Splint / Dressing °X Keep your splint, cast or dressing clean and dry.  Don’t put anything (coat hanger, pencil, etc) down inside of it.  If it gets damp, use a hair dryer on the cool setting to dry it.  If it gets soaked, call the office to schedule an appointment for a cast change. ° °After your dressing, cast or splint is removed; you may shower, but do not soak or scrub the wound.  Allow the water to run over it, and then gently pat it dry. ° °Swelling °It is normal for you to have swelling where you had surgery.  To reduce swelling and pain, keep your toes above your nose for at least 3 days after surgery.  It may be necessary to keep your foot or leg elevated for several weeks.  If it hurts, it should be elevated. ° °Follow Up °Call my office at [phone] when you are discharged from the hospital or surgery center to schedule an appointment to be seen two weeks after surgery. ° °Call my office at [phone] if you  develop a fever >101.5° F, nausea, vomiting, bleeding from the surgical site or severe pain.   ° ° ° °Post Anesthesia Home Care Instructions ° °Activity: °Get plenty of rest for the remainder of the day. A responsible individual must stay with you for 24 hours following the procedure.  °For the next 24 hours, DO NOT: °-Drive a car °-Operate machinery °-Drink alcoholic beverages °-Take any medication unless instructed by your physician °-Make any legal decisions or sign important papers. ° °Meals: °Start with liquid foods such as gelatin or soup. Progress to regular foods as tolerated. Avoid greasy, spicy, heavy foods. If nausea and/or vomiting occur, drink only clear liquids until the nausea and/or vomiting subsides. Call your physician if vomiting continues. ° °Special Instructions/Symptoms: °Your throat may feel dry or sore from the anesthesia or the breathing tube placed in your throat during surgery. If this causes discomfort, gargle with warm salt water. The discomfort should disappear within 24 hours. ° °If you had a scopolamine patch placed behind your ear for the management of post- operative nausea and/or vomiting: ° °1. The medication in the patch is effective for 72 hours, after which it should be removed.  Wrap patch in a tissue and discard in the trash. Wash hands thoroughly with soap and water. °2. You may remove the patch earlier than 72 hours if you experience unpleasant side   effects which may include dry mouth, dizziness or visual disturbances. °3. Avoid touching the patch. Wash your hands with soap and water after contact with the patch. °  ° ° °Regional Anesthesia Blocks ° °1. Numbness or the inability to move the "blocked" extremity may last from 3-48 hours after placement. The length of time depends on the medication injected and your individual response to the medication. If the numbness is not going away after 48 hours, call your surgeon. ° °2. The extremity that is blocked will need to be  protected until the numbness is gone and the  Strength has returned. Because you cannot feel it, you will need to take extra care to avoid injury. Because it may be weak, you may have difficulty moving it or using it. You may not know what position it is in without looking at it while the block is in effect. ° °3. For blocks in the legs and feet, returning to weight bearing and walking needs to be done carefully. You will need to wait until the numbness is entirely gone and the strength has returned. You should be able to move your leg and foot normally before you try and bear weight or walk. You will need someone to be with you when you first try to ensure you do not fall and possibly risk injury. ° °4. Bruising and tenderness at the needle site are common side effects and will resolve in a few days. ° °5. Persistent numbness or new problems with movement should be communicated to the surgeon or the [facility] ([phone])/ [facility] ([phone]). °

## 2018-09-26 NOTE — H&P (Signed)
Kenneth Kemp is an 23 y.o. male.   Chief Complaint: Right ankle pain HPI: The patient is a 23 year old male with a past medical history significant for sarcoidosis.  He presents today for operative treatment of his displaced and unstable right ankle medial malleolus fracture.  He injured his ankle just over a week ago when he fell [place] trampoline park.  He has been prescribed Humira but does not take it due to the cost.  He is not a smoker.  No history of diabetes.  Past Medical History:  Diagnosis Date  . Cataract   . Glaucoma   . 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 Panuveitis, bilateral   . Sarcoidosis     Past Surgical History:  Procedure Laterality Date  . EYE SURGERY     multiple procedures see [facility] notes in Care Everywhere    Family History  Family history unknown: Yes   Social History:  reports that he has never smoked. He has never used smokeless tobacco. He reports that he does not drink alcohol or use drugs.  Allergies: No Known Allergies  Medications: None  No results found for this or any previous visit (from the past 48 hour(s)). No results found.  ROS no recent fever, chills, nausea, vomiting or changes in his appetite  Height 5\' 11"  (1.803 m), weight 88.5 kg. Physical Exam  Well-nourished well-developed young man in no apparent distress.  Alert and oriented x4.  Mood and affect are normal.  Extraocular motions are intact.  Respirations are unlabored.  Gait is nonweightbearing on the right.  Right lower extremity is splinted.  The toes have brisk capillary refill.  Intact sensibility to light touch dorsally and plantarly at the forefoot.  Active plantar flexion and dorsiflexion strength at the toes.  Moderate swelling about the ankle.  Skin is otherwise healthy and intact.  No lymphadenopathy.  Assessment/Plan Right ankle displaced and unstable medial malleolus fracture -to the operating room today for operative treatment of this  injury.  After pre operative consent was obtained, and the correct operative site was identified, the patient was brought to the operating room and placed supine on the OR table.  Anesthesia was administered.  Pre-operative antibiotics were administered.  A surgical timeout was taken.  Toni Arthurs, MD 09/26/2018, 12:05 PM

## 2018-09-26 NOTE — Anesthesia Preprocedure Evaluation (Signed)
Anesthesia Evaluation  Patient identified by MRN, date of birth, ID band Patient awake    Reviewed: Allergy & Precautions, NPO status , Patient's Chart, lab work & pertinent test results  Airway Mallampati: II  TM Distance: >3 FB Neck ROM: Full    Dental  (+) Teeth Intact, Dental Advisory Given Upper braces:   Pulmonary neg pulmonary ROS,    Pulmonary exam normal breath sounds clear to auscultation       Cardiovascular Exercise Tolerance: Good negative cardio ROS Normal cardiovascular exam Rhythm:Regular Rate:Normal     Neuro/Psych Panuveitis, bilateral    GI/Hepatic negative GI ROS, Sarcoidosis    Endo/Other  negative endocrine ROS  Renal/GU negative Renal ROS     Musculoskeletal right ankle medial malleolus fracture   Abdominal   Peds  Hematology negative hematology ROS (+)   Anesthesia Other Findings Day of surgery medications reviewed with the patient.  Reproductive/Obstetrics                             Anesthesia Physical Anesthesia Plan  ASA: II  Anesthesia Plan: General   Post-op Pain Management:    Induction: Intravenous  PONV Risk Score and Plan: 2 and Ondansetron, Midazolam and Dexamethasone  Airway Management Planned: LMA  Additional Equipment:   Intra-op Plan:   Post-operative Plan: Extubation in OR  Informed Consent: I have reviewed the patients History and Physical, chart, labs and discussed the procedure including the risks, benefits and alternatives for the proposed anesthesia with the patient or authorized representative who has indicated his/her understanding and acceptance.     Dental advisory given  Plan Discussed with: CRNA  Anesthesia Plan Comments:         Anesthesia Quick Evaluation

## 2018-09-26 NOTE — Progress Notes (Signed)
Assisted Dr. Turk with right, ultrasound guided, popliteal, adductor canal block. Side rails up, monitors on throughout procedure. See vital signs in flow sheet. Tolerated Procedure well. 

## 2018-09-26 NOTE — Anesthesia Procedure Notes (Signed)
Procedure Name: LMA Insertion Date/Time: 09/26/2018 12:48 PM Performed by: Gar Gibbon, CRNA Pre-anesthesia Checklist: Patient identified, Emergency Drugs available, Suction available and Patient being monitored Patient Re-evaluated:Patient Re-evaluated prior to induction Oxygen Delivery Method: Circle system utilized Preoxygenation: Pre-oxygenation with 100% oxygen Induction Type: IV induction Ventilation: Mask ventilation without difficulty LMA: LMA inserted LMA Size: 4.0 Number of attempts: 1 Airway Equipment and Method: Bite block Placement Confirmation: positive ETCO2 Tube secured with: Tape Dental Injury: Teeth and Oropharynx as per pre-operative assessment

## 2018-09-26 NOTE — Anesthesia Procedure Notes (Signed)
Anesthesia Regional Block: Adductor canal block   Pre-Anesthetic Checklist: ,, timeout performed, Correct Patient, Correct Site, Correct Laterality, Correct Procedure, Correct Position, site marked, Risks and benefits discussed,  Surgical consent,  Pre-op evaluation,  At surgeon's request and post-op pain management  Laterality: Right  Prep: chloraprep       Needles:  Injection technique: Single-shot  Needle Type: Echogenic Needle     Needle Length: 9cm  Needle Gauge: 21     Additional Needles:   Procedures:,,,, ultrasound used (permanent image in chart),,,,  Narrative:  Start time: 09/26/2018 12:07 PM End time: 09/26/2018 12:10 PM Injection made incrementally with aspirations every 5 mL.  Performed by: Personally  Anesthesiologist: Cecile Hearing, MD  Additional Notes: No pain on injection. No increased resistance to injection. Injection made in 5cc increments.  Good needle visualization.  Patient tolerated procedure well.

## 2018-09-26 NOTE — Transfer of Care (Signed)
Immediate Anesthesia Transfer of Care Note  Patient: Kenneth Kemp  Procedure(s) Performed: Open treatment of right ankle fracture with internal fixation (Right Ankle)  Patient Location: PACU  Anesthesia Type:GA combined with regional for post-op pain  Level of Consciousness: awake, sedated and patient cooperative  Airway & Oxygen Therapy: Patient Spontanous Breathing and Patient connected to face mask oxygen  Post-op Assessment: Report given to RN and Post -op Vital signs reviewed and stable  Post vital signs: Reviewed and stable  Last Vitals:  Vitals Value Taken Time  BP 96/42 09/26/2018  2:04 PM  Temp    Pulse 63 09/26/2018  2:05 PM  Resp 7 09/26/2018  2:05 PM  SpO2 100 % 09/26/2018  2:05 PM  Vitals shown include unvalidated device data.  Last Pain: There were no vitals filed for this visit.       Complications: No apparent anesthesia complications

## 2018-09-27 ENCOUNTER — Encounter (HOSPITAL_BASED_OUTPATIENT_CLINIC_OR_DEPARTMENT_OTHER): Payer: Self-pay | Admitting: Orthopedic Surgery

## 2018-12-15 IMAGING — CR DG CHEST 2V
2 series · 2 of 2 positions shown · non-contrast
Comparison: 08/11/2017

CLINICAL DATA: Productive cough, history of sarcoid

EXAM:
CHEST  2 VIEW

[w chest pa]
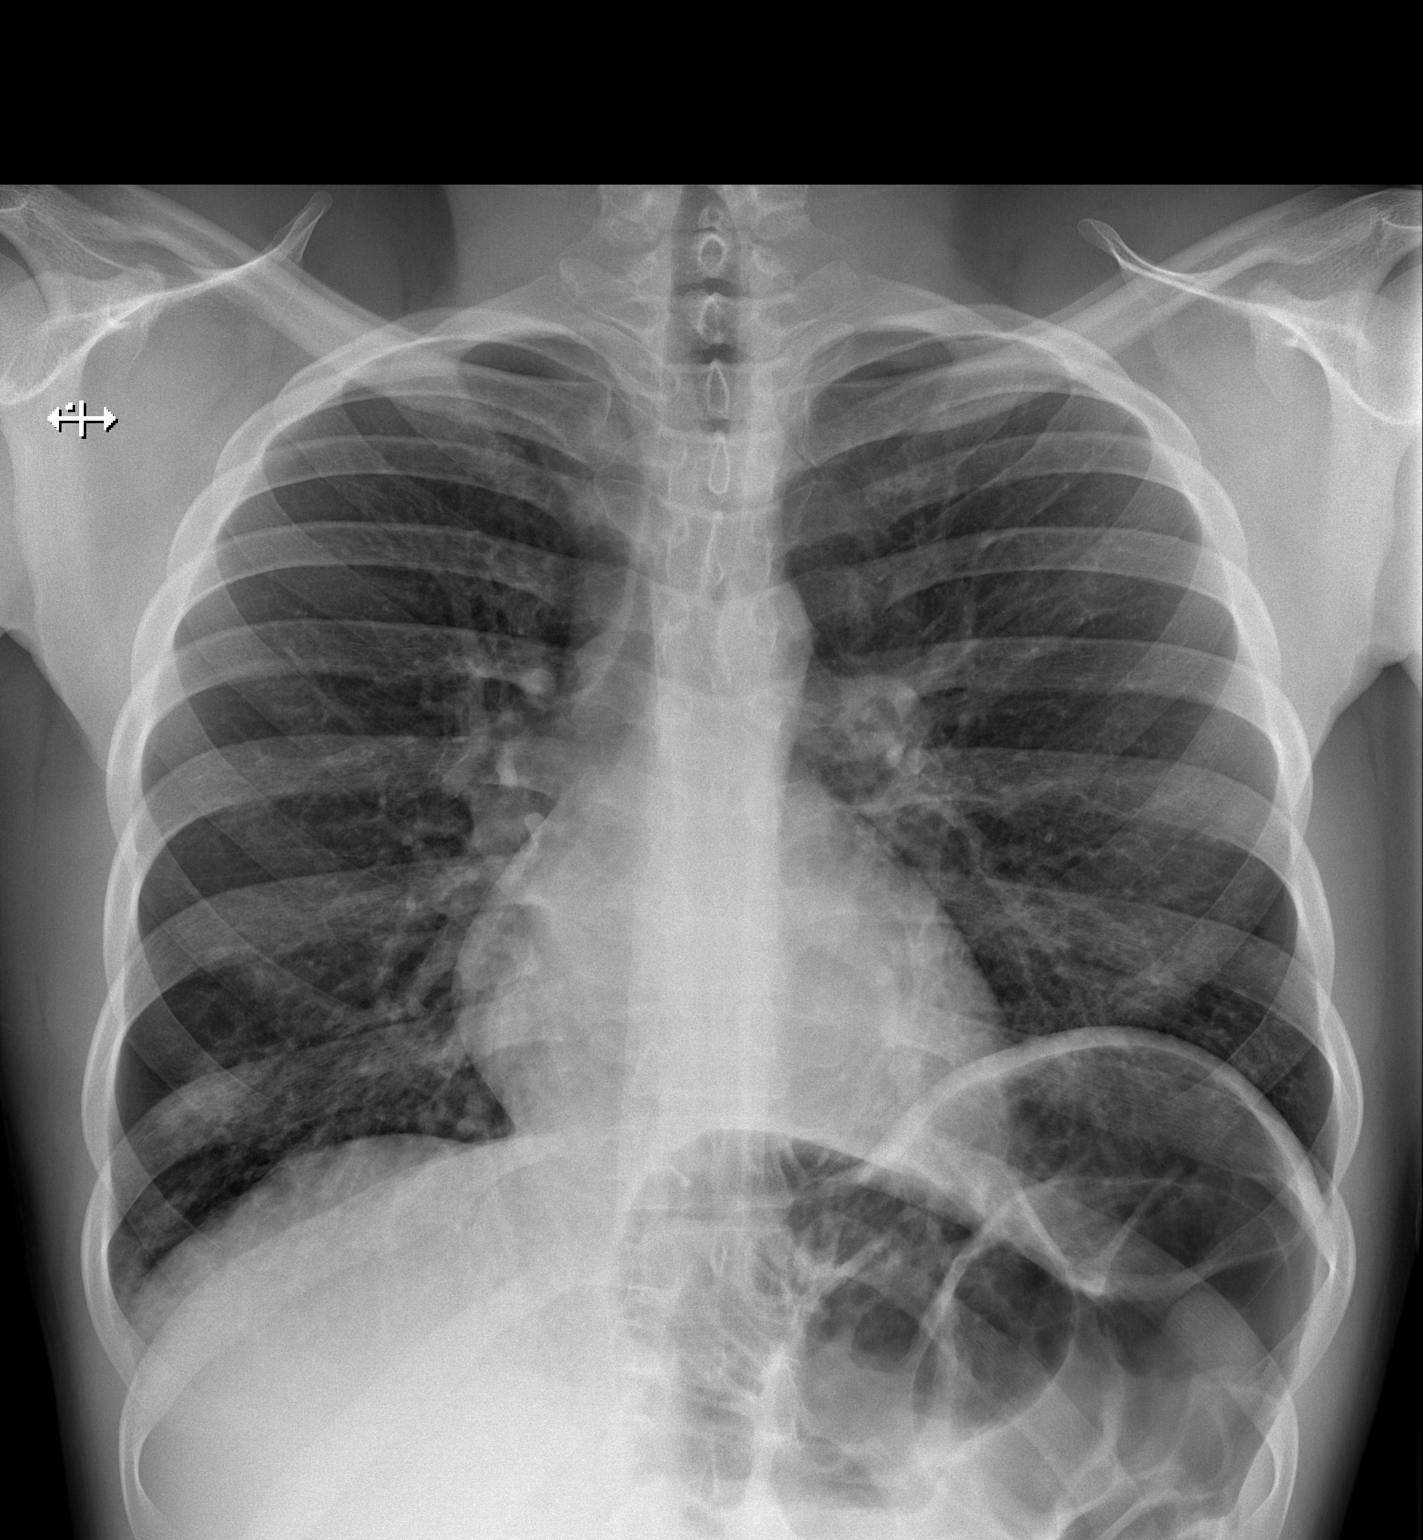

[w chest lat]
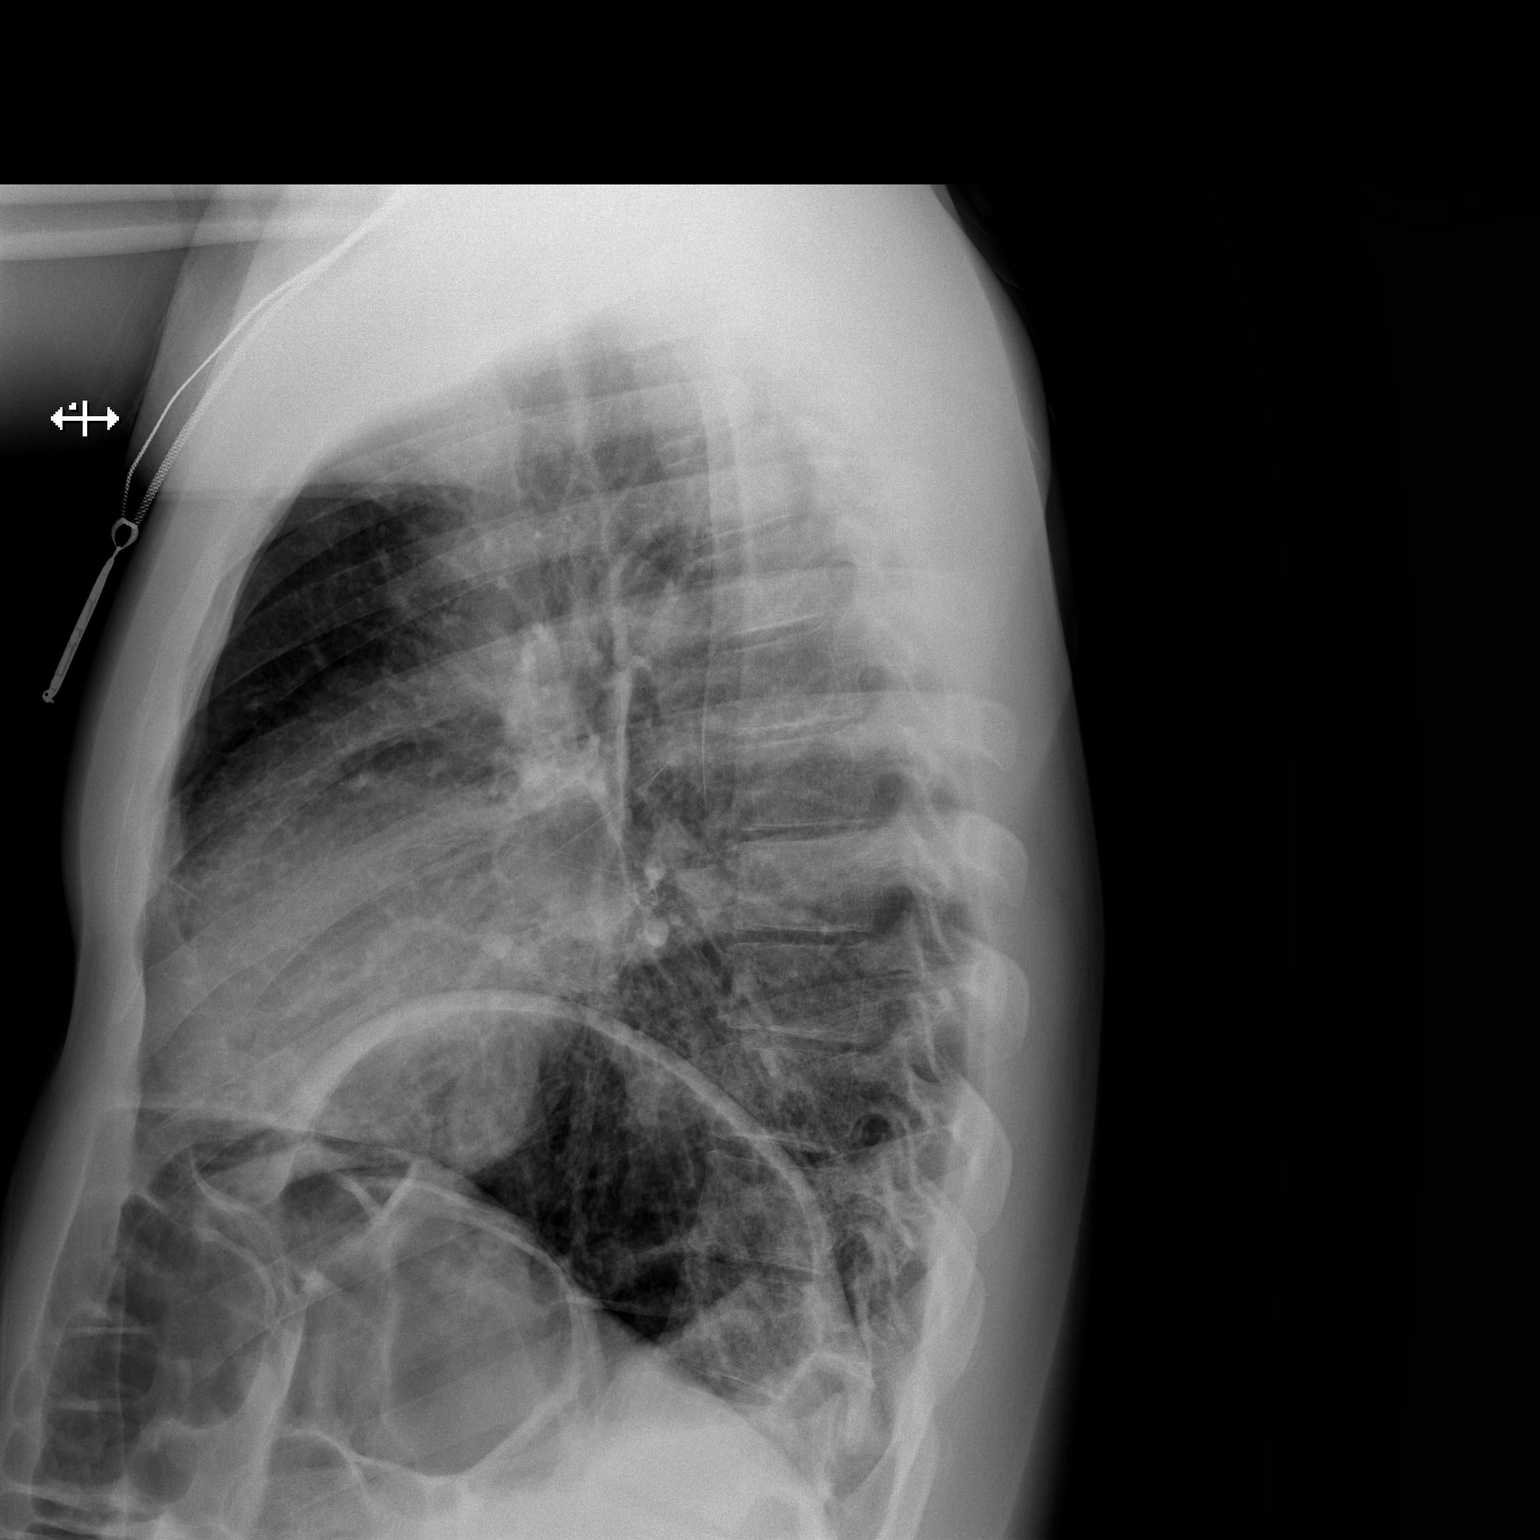

[2 of 2 positions shown; findings below may reference images not displayed]

FINDINGS: Small focus of opacity in the right lateral lung base. No pleural
effusion. Normal heart size. No pneumothorax.
IMPRESSION: Small focus of opacity in the right lateral lung base may reflect an
infiltrate.

## 2020-01-21 IMAGING — CR DG ANKLE COMPLETE 3+V*R*
3 series · 3 of 3 positions shown · non-contrast
Comparison: None.

CLINICAL DATA: Right ankle pain post trampoline injury.

EXAM:
RIGHT ANKLE - COMPLETE 3+ VIEW

[x ankle ap right]
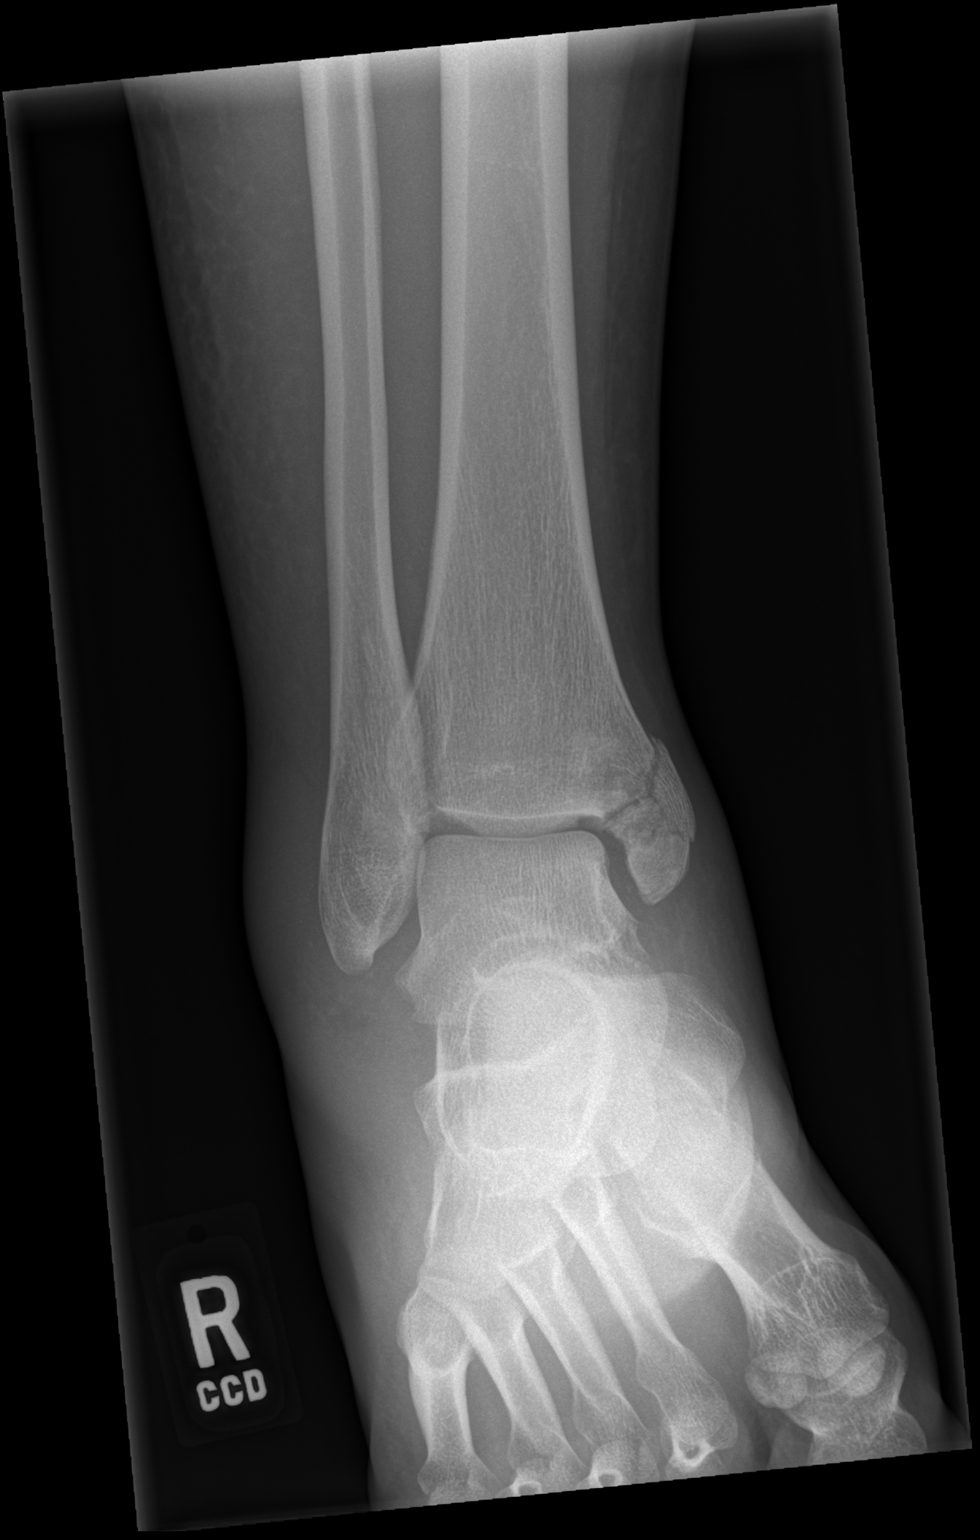

[x ankle obl right]
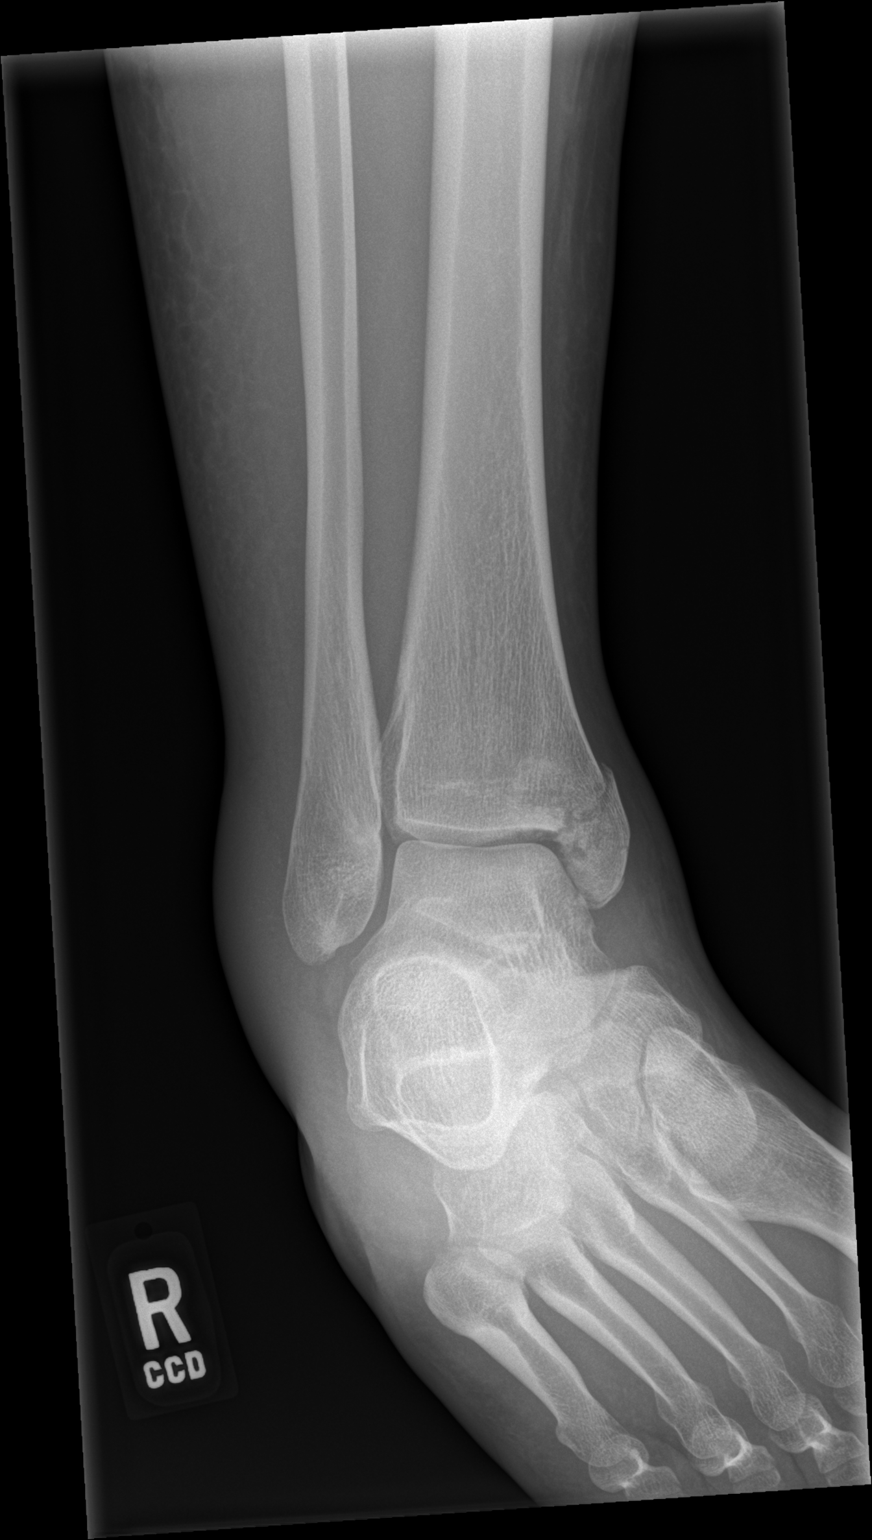

[x ankle lat right]
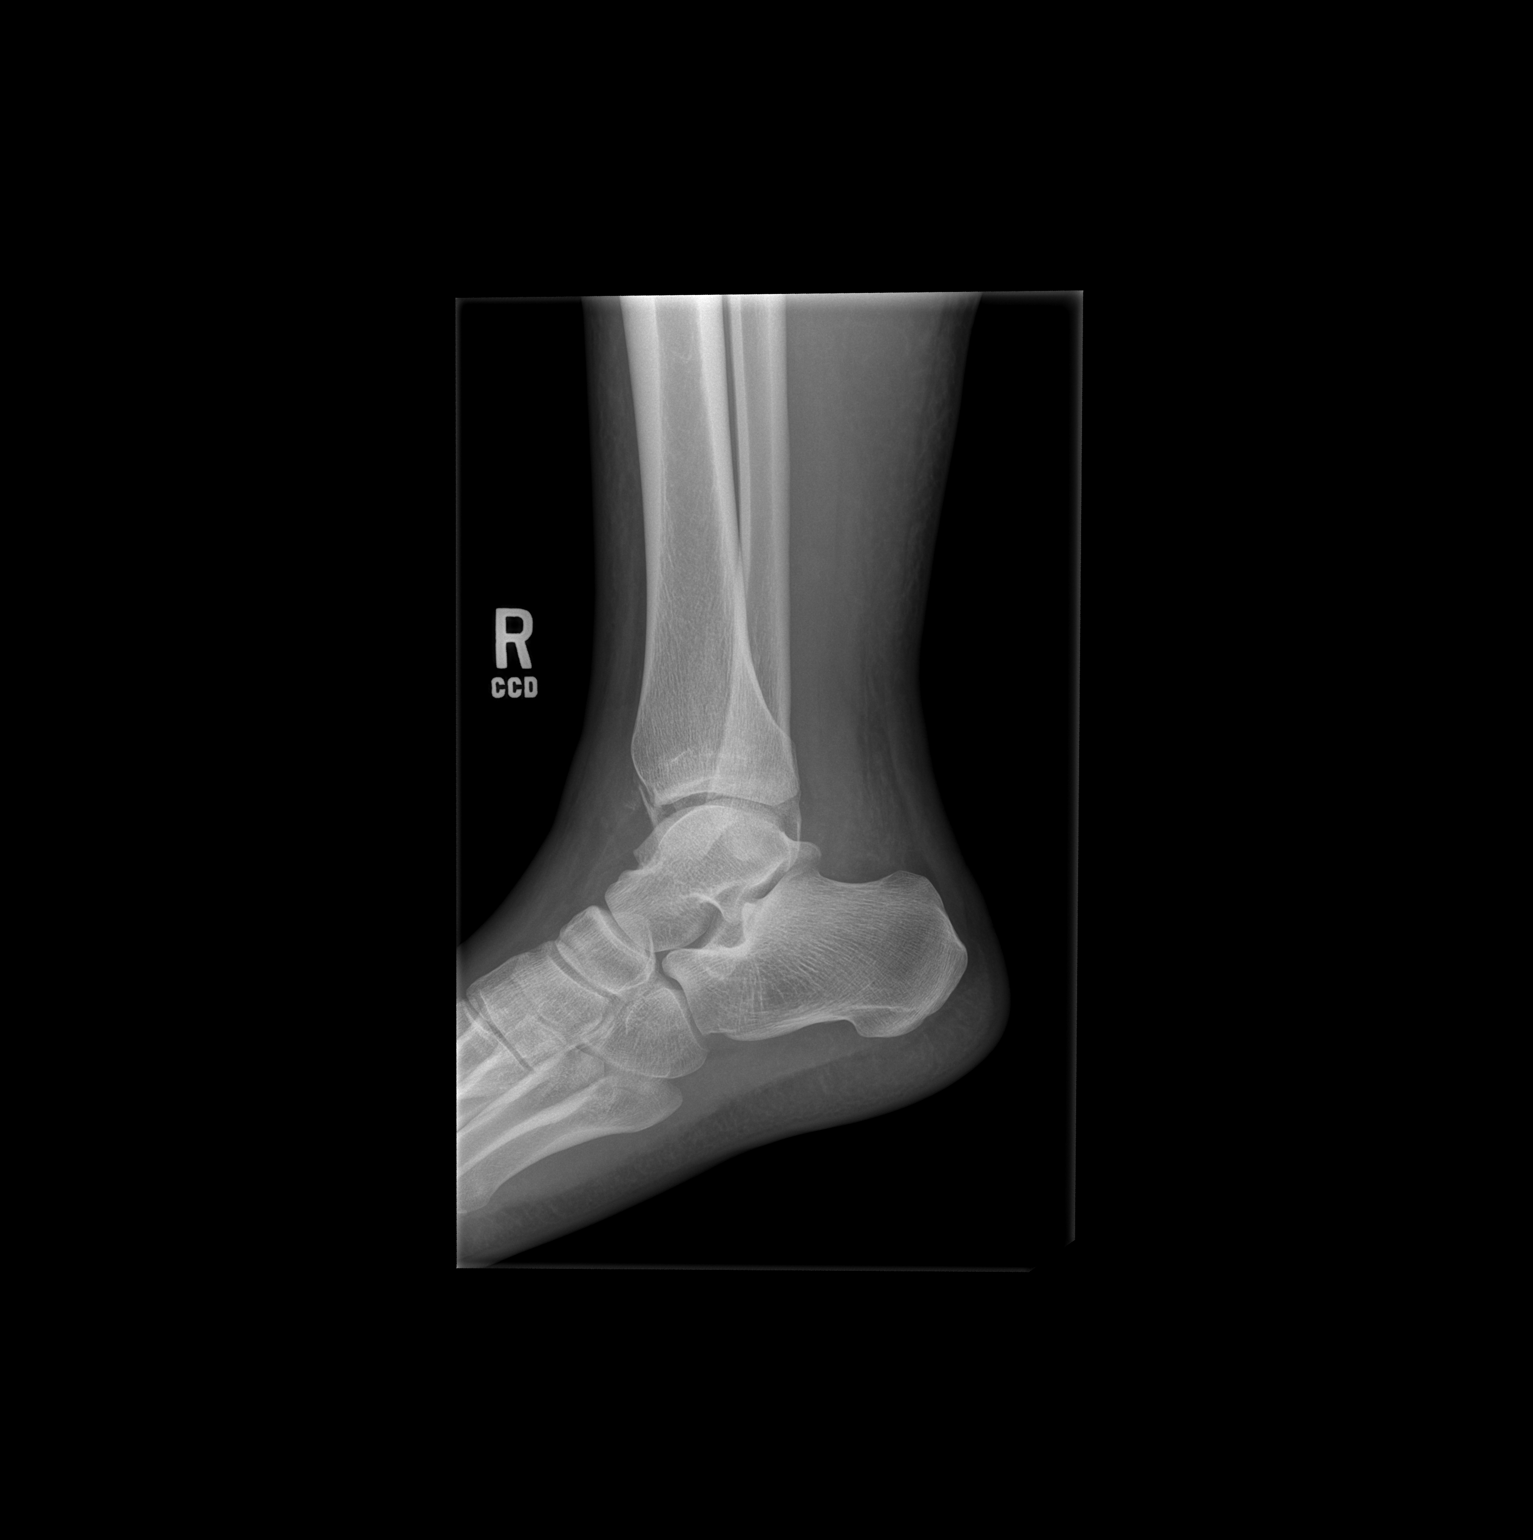

[3 of 3 positions shown; findings below may reference images not displayed]

FINDINGS: There is a severely comminuted fracture of the medial malleolus with
extension to the ankle mortise. There is a small joint effusion.
Medial and lateral soft tissue swelling noted.
IMPRESSION: Severely comminuted fracture of the medial malleolus with extension
to the ankle mortise.

## 2021-09-10 ENCOUNTER — Emergency Department (HOSPITAL_COMMUNITY)
Admission: EM | Admit: 2021-09-10 | Discharge: 2021-09-10 | Disposition: A | Discharge status: Home / Self Care | Payer: Self-pay | Attending: Emergency Medicine | Admitting: Emergency Medicine

## 2021-09-10 DIAGNOSIS — H6691 Otitis media, unspecified, right ear: Secondary | ICD-10-CM | POA: Insufficient documentation

## 2021-09-10 NOTE — ED Provider Notes (Signed)
°  [facility] Emergency Department Provider Note MRN:  [id number]  Arrival date & time: 09/10/21     Chief Complaint   No chief complaint on file.   History of Present Illness   Kenneth Kemp is a 26 y.o. year-old male presents to the ED with chief complaint of ear pain x2 to 3 days.  Reports ear fullness sensation.  Denies any fevers or chills.  States that he tried to clean out his ear with a Q-tip..    Review of Systems  Pertinent review of systems noted in HPI.    Physical Exam  There were no vitals filed for this visit.  CONSTITUTIONAL: Well-appearing, NAD NEURO:  Alert and oriented x 3, CN 3-12 grossly intact EYES:  eyes equal and reactive ENT/NECK:  Supple, no stridor, mucoid effusion present to right tympanic membrane CARDIO: Normal rate, regular rhythm, appears well-perfused  PULM:  No respiratory distress,  GI/GU:  non-distended,  MSK/SPINE:  No gross deformities, no edema, moves all extremities  SKIN:  no rash, atraumatic   *Additional and/or pertinent findings included in MDM below  Diagnostic and Interventional Summary    EKG Interpretation  Date/Time:    Ventricular Rate:    PR Interval:    QRS Duration:   QT Interval:    QTC Calculation:   R Axis:     Text Interpretation:         Labs Reviewed - No data to display  No orders to display    Medications - No data to display   Procedures  /  Critical Care Procedures  ED Course and Medical Decision Making  I have reviewed the triage vital signs, the nursing notes, and pertinent available records from the EMR.  Complexity of Problems Addressed Acute uncomplicated illness or injury with no diagnostics  Additional Data Reviewed and Analyzed Further history obtained from: Past medical history and medications listed in the EMR    ED Course    Patient here with uncomplicated otitis media.  Will treat with Augmentin.  Recommend outpatient follow-up.   Final  Clinical Impressions(s) / ED Diagnoses     ICD-10-CM   1. Right otitis media, unspecified otitis media type  H66.91       ED Discharge Orders     None        Discharge Instructions Discussed with and Provided to Patient:   Discharge Instructions   None      Montine Circle, PA-C 09/10/21 0636    Maudie Flakes, MD 09/10/21 [phone]

## 2021-09-10 NOTE — ED Triage Notes (Signed)
Please see downtime charting to include patients triage and discharge from the ED.
